# Patient Record
Sex: Male | Born: 1995 | Race: White | Hispanic: No | Marital: Single | Smoking: Current every day smoker
Health system: Southern US, Community
[De-identification: ages and names within clinical notes are randomized; demographics above are authoritative.]

## PROBLEM LIST (undated history)

## (undated) DIAGNOSIS — J45909 Unspecified asthma, uncomplicated: Secondary | ICD-10-CM

## (undated) HISTORY — PX: NO PAST SURGERIES: SHX2092

---
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
## 2017-03-16 ENCOUNTER — Encounter: Payer: Self-pay | Admitting: Emergency Medicine

## 2017-03-16 ENCOUNTER — Emergency Department: Payer: Self-pay

## 2017-03-16 ENCOUNTER — Emergency Department
Admission: EM | Admit: 2017-03-16 | Discharge: 2017-03-16 | Disposition: A | Discharge status: Home / Self Care | Payer: Self-pay | Attending: Emergency Medicine | Admitting: Emergency Medicine

## 2017-03-16 DIAGNOSIS — R109 Unspecified abdominal pain: Secondary | ICD-10-CM

## 2017-03-16 DIAGNOSIS — J45909 Unspecified asthma, uncomplicated: Secondary | ICD-10-CM | POA: Insufficient documentation

## 2017-03-16 DIAGNOSIS — F17210 Nicotine dependence, cigarettes, uncomplicated: Secondary | ICD-10-CM | POA: Insufficient documentation

## 2017-03-16 DIAGNOSIS — R1030 Lower abdominal pain, unspecified: Secondary | ICD-10-CM | POA: Insufficient documentation

## 2017-03-16 HISTORY — DX: Unspecified asthma, uncomplicated: J45.909

## 2017-03-16 LAB — COMPREHENSIVE METABOLIC PANEL
ALBUMIN: 4.9 g/dL (ref 3.5–5.0)
ALT: 14 U/L — ABNORMAL LOW (ref 17–63)
AST: 23 U/L (ref 15–41)
Alkaline Phosphatase: 53 U/L (ref 38–126)
Anion gap: 8 (ref 5–15)
BUN: 13 mg/dL (ref 6–20)
CALCIUM: 10.7 mg/dL — AB (ref 8.9–10.3)
CO2: 25 mmol/L (ref 22–32)
CREATININE: 1.01 mg/dL (ref 0.61–1.24)
Chloride: 104 mmol/L (ref 101–111)
GFR calc Af Amer: >60 mL/min (ref >60)
GFR calc non Af Amer: >60 mL/min (ref >60)
GLUCOSE: 101 mg/dL — AB (ref 65–99)
Potassium: 4 mmol/L (ref 3.5–5.1)
SODIUM: 137 mmol/L (ref 135–145)
Total Bilirubin: 0.6 mg/dL (ref 0.3–1.2)
Total Protein: 8.4 g/dL — ABNORMAL HIGH (ref 6.5–8.1)

## 2017-03-16 LAB — CBC
HCT: 47 % (ref 40.0–52.0)
Hemoglobin: 16.4 g/dL (ref 13.0–18.0)
MCH: 32 pg (ref 26.0–34.0)
MCHC: 34.9 g/dL (ref 32.0–36.0)
MCV: 91.6 fL (ref 80.0–100.0)
PLATELETS: 207 10*3/uL (ref 150–440)
RBC: 5.14 MIL/uL (ref 4.40–5.90)
RDW: 13.6 % (ref 11.5–14.5)
WBC: 8.4 10*3/uL (ref 3.8–10.6)

## 2017-03-16 LAB — LIPASE, BLOOD: Lipase: 36 U/L (ref 11–51)

## 2017-03-16 MED ORDER — IOPAMIDOL (ISOVUE-300) INJECTION 61%
100.0000 mL | Freq: Once | INTRAVENOUS | Status: AC | PRN
  Administered 2017-03-16: 100 mL via INTRAVENOUS
  Filled 2017-03-16: qty 100

## 2017-03-16 MED ORDER — METRONIDAZOLE 500 MG PO TABS: 500.0000 mg | ORAL_TABLET | Freq: Two times a day (BID) | ORAL | 0 refills | Status: AC

## 2017-03-16 MED ORDER — CIPROFLOXACIN HCL 500 MG PO TABS: 500.0000 mg | ORAL_TABLET | Freq: Two times a day (BID) | ORAL | 0 refills | Status: AC

## 2017-03-16 NOTE — ED Provider Notes (Signed)
[facility] Emergency Department Provider Note    First MD Initiated Contact with Patient 03/16/17 1123     (approximate)  I have reviewed the triage vital signs and the nursing notes.   HISTORY  Chief Complaint Abdominal Pain    HPI Blake Irwin is a 21 y.o. male resents with 2 week history of lower abdominal pain blood and mucus noted in stool. Patient states last few bowel movements have been "nothing but mucous with blood". Patient does admit to generalized abdominal pain with a pain score 5 out of 10 described as sharp. Patient denies any vomiting no nausea. Patient denies any fever no urinary symptoms.   Past Medical History:  Diagnosis Date  . Asthma     There are no active problems to display for this patient.   Past surgical history None  Prior to Admission medications   Medication Sig Start Date End Date Taking? Authorizing Provider  ciprofloxacin (CIPRO) 500 MG tablet Take 1 tablet (500 mg total) by mouth 2 (two) times daily. 03/16/17 03/26/17  Darci Current, MD  metroNIDAZOLE (FLAGYL) 500 MG tablet Take 1 tablet (500 mg total) by mouth 2 (two) times daily. 03/16/17 03/26/17  Darci Current, MD    Allergies Patient has no known allergies.  No family history on file.  Social History Social History  Substance Use Topics  . Smoking status: Current Every Day Smoker    Packs/day: 1.00    Types: Cigarettes  . Smokeless tobacco: Not on file  . Alcohol use Not on file    Review of Systems Constitutional: No fever/chills Eyes: No visual changes. ENT: No sore throat. Cardiovascular: Denies chest pain. Respiratory: Denies shortness of breath. Gastrointestinal: Positive for abdominal pain mucus and blood per rectum Genitourinary: Negative for dysuria. Musculoskeletal: Negative for back pain. Integumentary: Negative for rash. Neurological: Negative for headaches, focal weakness or  numbness.   ____________________________________________   PHYSICAL EXAM:  VITAL SIGNS: ED Triage Vitals  Enc Vitals Group     BP 03/16/17 0941 (!) 149/99     Pulse Rate 03/16/17 0941 87     Resp 03/16/17 0941 18     Temp 03/16/17 0941 97.8 F (36.6 C)     Temp Source 03/16/17 0941 Oral     SpO2 03/16/17 0941 99 %     Weight 03/16/17 0943 160 lb (72.6 kg)     Height 03/16/17 0943 5\' 10"  (1.778 m)     Head Circumference --      Peak Flow --      Pain Score 03/16/17 0941 5     Pain Loc --      Pain Edu? --      Excl. in GC? --     Constitutional: Alert and oriented. Well appearing and in no acute distress. Eyes: Conjunctivae are normal. PERRL. EOMI. Head: Atraumatic. Mouth/Throat: Mucous membranes are moist.Oropharynx non-erythematous. Neck: No stridor.   Cardiovascular: Normal rate, regular rhythm. Good peripheral circulation. Grossly normal heart sounds. Respiratory: Normal respiratory effort.  No retractions. Lungs CTAB. Gastrointestinal: Soft and nontender. No distention. Guaiac-positive stools Musculoskeletal: No lower extremity tenderness nor edema. No gross deformities of extremities. Neurologic:  Normal speech and language. No gross focal neurologic deficits are appreciated.  Skin:  Skin is warm, dry and intact. No rash noted. Psychiatric: Mood and affect are normal. Speech and behavior are normal.  ____________________________________________   LABS (all labs ordered are listed, but only abnormal results are displayed)  Labs Reviewed  COMPREHENSIVE METABOLIC PANEL - Abnormal; Notable for the following:       Result Value   Glucose, Bld 101 (*)    Calcium 10.7 (*)    Total Protein 8.4 (*)    ALT 14 (*)    All other components within normal limits  LIPASE, BLOOD  CBC    RADIOLOGY I, Gary N Jahmeir Geisen, personally viewed and evaluated these images (plain radiographs) as part of my medical decision making, as well as reviewing the written report by the  radiologist.  Ct Abdomen Pelvis W Contrast  Result Date: 03/16/2017 CLINICAL DATA:  Lower abdominal pain for 2 weeks. EXAM: CT ABDOMEN AND PELVIS WITH CONTRAST TECHNIQUE: Multidetector CT imaging of the abdomen and pelvis was performed using the standard protocol following bolus administration of intravenous contrast. CONTRAST:  100mL ISOVUE-300 IOPAMIDOL (ISOVUE-300) INJECTION 61% COMPARISON:  None. FINDINGS: Lower chest: No acute abnormality. Hepatobiliary: No focal liver abnormality is seen. No gallstones, gallbladder wall thickening, or biliary dilatation. Pancreas: Unremarkable. No pancreatic ductal dilatation or surrounding inflammatory changes. Spleen: Normal in size without focal abnormality. Adrenals/Urinary Tract: Adrenal glands appear normal. Bilateral renal cysts are noted. No hydronephrosis or renal obstruction is noted. No renal or ureteral calculi are noted. Urinary bladder appears normal. Stomach/Bowel: Stomach is within normal limits. Appendix appears normal. No evidence of bowel wall thickening, distention, or inflammatory changes. Vascular/Lymphatic: No significant vascular findings are present. No enlarged abdominal or pelvic lymph nodes. Reproductive: Prostate is unremarkable. Other: No abdominal wall hernia or abnormality. No abdominopelvic ascites. Musculoskeletal: No acute or significant osseous findings. IMPRESSION: No acute abnormality seen in the abdomen or pelvis. Electronically Signed   By: Lupita RaiderJames  Green Jr, M.D.   On: 03/16/2017 12:16      Procedures   ____________________________________________   INITIAL IMPRESSION / ASSESSMENT AND PLAN / ED COURSE  Pertinent labs & imaging results that were available during my care of the patient were reviewed by me and considered in my medical decision making (see chart for details).  Given history and physical exam concern for inflammatory bowel disease. She CT scan revealed no acute abnormality in the abdomen and pelvis. Patient  will be referred to Dr. Servando SnareWohl gastroenterologist for further outpatient evaluation      ____________________________________________  FINAL CLINICAL IMPRESSION(S) / ED DIAGNOSES  Final diagnoses:  Abdominal pain, unspecified abdominal location     MEDICATIONS GIVEN DURING THIS VISIT:  Medications  iopamidol (ISOVUE-300) 61 % injection 100 mL (100 mLs Intravenous Contrast Given 03/16/17 1156)     NEW OUTPATIENT MEDICATIONS STARTED DURING THIS VISIT:  New Prescriptions   CIPROFLOXACIN (CIPRO) 500 MG TABLET    Take 1 tablet (500 mg total) by mouth 2 (two) times daily.   METRONIDAZOLE (FLAGYL) 500 MG TABLET    Take 1 tablet (500 mg total) by mouth 2 (two) times daily.    Modified Medications   No medications on file    Discontinued Medications   No medications on file     Note:  This document was prepared using Dragon voice recognition software and may include unintentional dictation errors.    Darci CurrentBrown,  N, MD 03/16/17 [phone]

## 2017-03-16 NOTE — ED Notes (Signed)
ED Provider at bedside. 

## 2017-03-16 NOTE — ED Triage Notes (Signed)
Lower abd pain with small amounts of blood and mucous x 2 weeks.

## 2017-04-01 ENCOUNTER — Encounter: Payer: Self-pay | Admitting: Gastroenterology

## 2017-04-01 ENCOUNTER — Ambulatory Visit (INDEPENDENT_AMBULATORY_CARE_PROVIDER_SITE_OTHER): Payer: Self-pay | Admitting: Gastroenterology

## 2017-04-01 ENCOUNTER — Other Ambulatory Visit: Payer: Self-pay

## 2017-04-01 VITALS — BP 137/71 | HR 80 | Temp 98.3°F | Ht 70.5 in | Wt 148.5 lb

## 2017-04-01 DIAGNOSIS — K921 Melena: Secondary | ICD-10-CM

## 2017-04-01 DIAGNOSIS — R1013 Epigastric pain: Secondary | ICD-10-CM

## 2017-04-01 DIAGNOSIS — R634 Abnormal weight loss: Secondary | ICD-10-CM

## 2017-04-01 NOTE — Progress Notes (Signed)
Gastroenterology Consultation  Referring Provider:     Dr. Manson Passey Primary Care Physician:  Patient, No Pcp Per Primary Gastroenterologist:  Dr. Servando Snare     Reason for Consultation:     Abdominal pain with hematochezia        HPI:   Blake Irwin is a 21 y.o. y/o male referred for consultation & management of Abdominal pain with hematochezia by Dr. Patient, No Pcp Per.  This patient comes in today after being seen in emergency room back on the 14th of this month due to a history of abdominal pain in the lower abdomen that started 2 weeks before he was seen in the emergency room. The patient states that his bowel movements at that time were nothing but blood and mucus. He also states that he had abdominal pain at that time that was a 5 out of 10. The patient had a CT scan of the abdomen at that time that did not show any abnormalities in the abdomen or pelvis. His CBC was normal with a normal white cell count and normal red cell count. The patient's liver enzymes were also checked and were not elevated. There is no elevation in the patient's lipase. The ER was concerned that the patient may have inflammatory bowel disease despite no acute abnormality seen on the CT scan. The patient reports that he has had some weight loss. The patient was then sent to me for further evaluation.   Past Medical History:  Diagnosis Date  . Asthma     No past surgical history on file.  Prior to Admission medications   Not on File    No family history on file.   Social History  Substance Use Topics  . Smoking status: Current Every Day Smoker    Packs/day: 1.00    Types: Cigarettes  . Smokeless tobacco: Not on file  . Alcohol use Not on file    Allergies as of 04/01/2017  . (No Known Allergies)    Review of Systems:    All systems reviewed and negative except where noted in HPI.   Physical Exam:  There were no vitals taken for this visit. No LMP for male patient. Psych:  Alert and cooperative.  Normal mood and affect. General:   Alert,  Well-developed, well-nourished, pleasant and cooperative in NAD Head:  Normocephalic and atraumatic. Eyes:  Sclera clear, no icterus.   Conjunctiva pink. Ears:  Normal auditory acuity. Nose:  No deformity, discharge, or lesions. Mouth:  No deformity or lesions,oropharynx pink & moist. Neck:  Supple; no masses or thyromegaly. Lungs:  Respirations even and unlabored.  Clear throughout to auscultation.   No wheezes, crackles, or rhonchi. No acute distress. Heart:  Regular rate and rhythm; no murmurs, clicks, rubs, or gallops. Abdomen:  Normal bowel sounds.  No bruits.  Soft, mildly diffusely tender and non-distended without masses, hepatosplenomegaly or hernias noted.  No guarding or rebound tenderness.  Negative Carnett sign.   Rectal:  Deferred.  Msk:  Symmetrical without gross deformities.  Good, equal movement & strength bilaterally. Pulses:  Normal pulses noted. Extremities:  No clubbing or edema.  No cyanosis. Neurologic:  Alert and oriented x3;  grossly normal neurologically. Skin:  Intact without significant lesions or rashes.  No jaundice. Lymph Nodes:  No significant cervical adenopathy. Psych:  Alert and cooperative. Normal mood and affect.  Imaging Studies: Ct Abdomen Pelvis W Contrast  Result Date: 03/16/2017 CLINICAL DATA:  Lower abdominal pain for 2 weeks. EXAM: CT  ABDOMEN AND PELVIS WITH CONTRAST TECHNIQUE: Multidetector CT imaging of the abdomen and pelvis was performed using the standard protocol following bolus administration of intravenous contrast. CONTRAST:  100mL ISOVUE-300 IOPAMIDOL (ISOVUE-300) INJECTION 61% COMPARISON:  None. FINDINGS: Lower chest: No acute abnormality. Hepatobiliary: No focal liver abnormality is seen. No gallstones, gallbladder wall thickening, or biliary dilatation. Pancreas: Unremarkable. No pancreatic ductal dilatation or surrounding inflammatory changes. Spleen: Normal in size without focal abnormality.  Adrenals/Urinary Tract: Adrenal glands appear normal. Bilateral renal cysts are noted. No hydronephrosis or renal obstruction is noted. No renal or ureteral calculi are noted. Urinary bladder appears normal. Stomach/Bowel: Stomach is within normal limits. Appendix appears normal. No evidence of bowel wall thickening, distention, or inflammatory changes. Vascular/Lymphatic: No significant vascular findings are present. No enlarged abdominal or pelvic lymph nodes. Reproductive: Prostate is unremarkable. Other: No abdominal wall hernia or abnormality. No abdominopelvic ascites. Musculoskeletal: No acute or significant osseous findings. IMPRESSION: No acute abnormality seen in the abdomen or pelvis. Electronically Signed   By: Lupita RaiderJames  Green Jr, M.D.   On: 03/16/2017 12:16    Assessment and Plan:   Blake DaftJacob L Irwin is a 21 y.o. y/o male who has been having weight loss rectal bleeding with mucus per rectum and lower abdominal pain. The patient is concerned that he may have Crohn's disease. He has a great aunts who has ulcerative colitis and another great aunt who has irritable bowel syndrome. The patient has been recommended to have a colonoscopy to rule out inflammatory bowel disease. I have discussed risks & benefits which include, but are not limited to, bleeding, infection, perforation & drug reaction.  The patient agrees with this plan & written consent will be obtained.     Blake Miniumarren Nikash Mortensen, MD. Blake GrahamFACG   Note: This dictation was prepared with Dragon dictation along with smaller phrase technology. Any transcriptional errors that result from this process are unintentional.

## 2017-04-05 ENCOUNTER — Emergency Department: Payer: Self-pay

## 2017-04-05 ENCOUNTER — Encounter: Payer: Self-pay | Admitting: Emergency Medicine

## 2017-04-05 ENCOUNTER — Emergency Department
Admission: EM | Admit: 2017-04-05 | Discharge: 2017-04-05 | Disposition: A | Discharge status: Home / Self Care | Payer: Self-pay | Attending: Emergency Medicine | Admitting: Emergency Medicine

## 2017-04-05 DIAGNOSIS — N23 Unspecified renal colic: Secondary | ICD-10-CM | POA: Insufficient documentation

## 2017-04-05 DIAGNOSIS — J45909 Unspecified asthma, uncomplicated: Secondary | ICD-10-CM | POA: Insufficient documentation

## 2017-04-05 DIAGNOSIS — F17210 Nicotine dependence, cigarettes, uncomplicated: Secondary | ICD-10-CM | POA: Insufficient documentation

## 2017-04-05 DIAGNOSIS — N200 Calculus of kidney: Secondary | ICD-10-CM | POA: Insufficient documentation

## 2017-04-05 LAB — COMPREHENSIVE METABOLIC PANEL
ALBUMIN: 4.9 g/dL (ref 3.5–5.0)
ALT: 15 U/L — ABNORMAL LOW (ref 17–63)
ANION GAP: 9 (ref 5–15)
AST: 18 U/L (ref 15–41)
Alkaline Phosphatase: 53 U/L (ref 38–126)
BUN: 8 mg/dL (ref 6–20)
CO2: 25 mmol/L (ref 22–32)
Calcium: 9.6 mg/dL (ref 8.9–10.3)
Chloride: 104 mmol/L (ref 101–111)
Creatinine, Ser: 1.11 mg/dL (ref 0.61–1.24)
GFR calc non Af Amer: >60 mL/min (ref >60)
GLUCOSE: 108 mg/dL — AB (ref 65–99)
Potassium: 4.1 mmol/L (ref 3.5–5.1)
SODIUM: 138 mmol/L (ref 135–145)
Total Bilirubin: 0.9 mg/dL (ref 0.3–1.2)
Total Protein: 7.8 g/dL (ref 6.5–8.1)

## 2017-04-05 LAB — URINALYSIS, COMPLETE (UACMP) WITH MICROSCOPIC
BACTERIA UA: NONE SEEN
Bilirubin Urine: NEGATIVE
Glucose, UA: NEGATIVE mg/dL
Ketones, ur: NEGATIVE mg/dL
Leukocytes, UA: NEGATIVE
Nitrite: NEGATIVE
PROTEIN: 30 mg/dL — AB
Specific Gravity, Urine: 1.024 (ref 1.005–1.030)
pH: 6 (ref 5.0–8.0)

## 2017-04-05 LAB — CBC
HCT: 46.1 % (ref 40.0–52.0)
Hemoglobin: 16.2 g/dL (ref 13.0–18.0)
MCH: 32.1 pg (ref 26.0–34.0)
MCHC: 35 g/dL (ref 32.0–36.0)
MCV: 91.8 fL (ref 80.0–100.0)
Platelets: 188 10*3/uL (ref 150–440)
RBC: 5.03 MIL/uL (ref 4.40–5.90)
RDW: 13.8 % (ref 11.5–14.5)
WBC: 10.3 10*3/uL (ref 3.8–10.6)

## 2017-04-05 LAB — LIPASE, BLOOD: LIPASE: 43 U/L (ref 11–51)

## 2017-04-05 MED ORDER — OXYCODONE-ACETAMINOPHEN 5-325 MG PO TABS
1.0000 | ORAL_TABLET | ORAL | Status: DC | PRN
  Administered 2017-04-05: 1 via ORAL
  Filled 2017-04-05: qty 1

## 2017-04-05 MED ORDER — DOCUSATE SODIUM 100 MG PO CAPS: ORAL_CAPSULE | ORAL | 0 refills | Status: AC

## 2017-04-05 MED ORDER — ONDANSETRON HCL 4 MG PO TABS: ORAL_TABLET | ORAL | 0 refills | Status: AC

## 2017-04-05 MED ORDER — HYDROCODONE-ACETAMINOPHEN 5-325 MG PO TABS: 1.0000 | ORAL_TABLET | ORAL | 0 refills | Status: AC | PRN

## 2017-04-05 MED ORDER — TAMSULOSIN HCL 0.4 MG PO CAPS: ORAL_CAPSULE | ORAL | 0 refills | Status: AC

## 2017-04-05 MED ORDER — ONDANSETRON 4 MG PO TBDP
4.0000 mg | ORAL_TABLET | Freq: Once | ORAL | Status: AC | PRN
  Administered 2017-04-05: 4 mg via ORAL
  Filled 2017-04-05: qty 1

## 2017-04-05 NOTE — ED Triage Notes (Signed)
Pt presents to ED c/o RLQ pain wrapping around to R lower back and radiating into R groin. Pt states difficulty urinating, only dribbles at a time.

## 2017-04-05 NOTE — Discharge Instructions (Signed)
You have been seen in the Emergency Department (ED) today for pain that we believe based on your workup, is caused by kidney stones.  As we have discussed, please drink plenty of fluids.  Please make a follow up appointment with the physician(s) listed elsewhere in this documentation. ° °You may take pain medication as needed but ONLY as prescribed.  Please also take your prescribed Flomax daily.  We also recommend that you take over-the-counter ibuprofen regularly according to label instructions over the next 5 days.  Take it with meals to minimize stomach discomfort. ° °Please see your doctor as soon as possible as stones may take 1-3 weeks to pass and you may require additional care or medications. ° °Do not drink alcohol, drive or participate in any other potentially dangerous activities while taking opiate pain medication as it may make you sleepy. Do not take this medication with any other sedating medications, either prescription or over-the-counter. If you were prescribed Percocet or Vicodin, do not take these with acetaminophen (Tylenol) as it is already contained within these medications. °  °Take Norco as needed for severe pain.  This medication is an opiate (or narcotic) pain medication and can be habit forming.  Use it as little as possible to achieve adequate pain control.  Do not use or use it with extreme caution if you have a history of opiate abuse or dependence.  If you are on a pain contract with your primary care doctor or a pain specialist, be sure to let them know you were prescribed this medication today from the [facility].  This medication is intended for your use only - do not give any to anyone else and keep it in a secure place where nobody else, especially children, have access to it.  It will also cause or worsen constipation, so you may want to consider taking an over-the-counter stool softener while you are taking this medication. ° °Return to the  Emergency Department (ED) or call your doctor if you have any worsening pain, fever, painful urination, are unable to urinate, or develop other symptoms that concern you. ° °

## 2017-04-05 NOTE — ED Provider Notes (Addendum)
[facility] Emergency Department Provider Note  ____________________________________________   First MD Initiated Contact with Patient 04/05/17 1447     (approximate)  I have reviewed the triage vital signs and the nursing notes.   HISTORY  Chief Complaint Flank Pain    HPI Blake Irwin is a 21 y.o. male with no specific history but who is being worked up for possible inflammatory bowel disease by gastroenterology.  He presents for evaluation of about 2 days of persistent and waxing and waning but essentially constant pain in his right flank that is radiated to the right lower part of his abdomen.  He has no history of kidney stones.  He describes the pain as sharp and stabbing acutely and followed by a dull ache that radiates from his flank to his right lower abdomen.  It is accompanied with nausea and he feels like he is having difficulty urinating.  Nothing particular makes it better nor worse.  He denies fever/chills, chest pain, shortness of breath, diarrhea, and GI bleeding.  He has not specifically seen any blood in his urine.   Past Medical History:  Diagnosis Date  . Asthma     There are no active problems to display for this patient.   Past Surgical History:  Procedure Laterality Date  . NO PAST SURGERIES      Prior to Admission medications   Medication Sig Start Date End Date Taking? Authorizing Provider  docusate sodium (COLACE) 100 MG capsule Take 1 tablet once or twice daily as needed for constipation while taking narcotic pain medicine 04/05/17   Loleta Rose, MD  HYDROcodone-acetaminophen (NORCO/VICODIN) 5-325 MG tablet Take 1-2 tablets by mouth every 4 (four) hours as needed for moderate pain. 04/05/17   Loleta Rose, MD  ondansetron [facility]) 4 MG tablet Take 1-2 tabs by mouth every 8 hours as needed for nausea/vomiting 04/05/17   Loleta Rose, MD  tamsulosin [facility]) 0.4 MG CAPS capsule Take 1 tablet by mouth daily until you pass the  kidney stone or no longer have symptoms 04/05/17   Loleta Rose, MD    Allergies Patient has no known allergies.  Family History  Problem Relation Age of Onset  . Hypertension Mother   . Drug abuse Father   . Ulcerative colitis Maternal Aunt     Social History Social History  Substance Use Topics  . Smoking status: Current Every Day Smoker    Packs/day: 1.00    Types: Cigarettes  . Smokeless tobacco: Former Neurosurgeon  . Alcohol use No    Review of Systems Constitutional: No fever/chills Eyes: No visual changes. ENT: No sore throat. Cardiovascular: Denies chest pain. Respiratory: Denies shortness of breath. Gastrointestinal: Right-sided flank pain radiating to the right lower quadrant of his abdomen over the last couple of days with nausea. Genitourinary: Negative for dysuria.  Difficulty urinating. Musculoskeletal: Negative for neck pain.  Right Flank pain. Integumentary: Negative for rash. Neurological: Negative for headaches, focal weakness or numbness.   ____________________________________________   PHYSICAL EXAM:  VITAL SIGNS: ED Triage Vitals  Enc Vitals Group     BP 04/05/17 1259 (!) 141/91     Pulse Rate 04/05/17 1259 (!) 102     Resp 04/05/17 1259 16     Temp 04/05/17 1259 98.4 F (36.9 C)     Temp Source 04/05/17 1259 Oral     SpO2 04/05/17 1259 99 %     Weight 04/05/17 1259 67.1 kg (148 lb)     Height 04/05/17  1259 1.778 m (5\' 10" )     Head Circumference --      Peak Flow --      Pain Score 04/05/17 1303 10     Pain Loc --      Pain Edu? --      Excl. in GC? --     Constitutional: Alert and oriented. Well appearing and in no acute distress. Eyes: Conjunctivae are normal.  Head: Atraumatic. Nose: No congestion/rhinnorhea. Mouth/Throat: Mucous membranes are moist. Neck: No stridor.  No meningeal signs.   Cardiovascular: Normal rate, regular rhythm. Good peripheral circulation. Grossly normal heart sounds. Respiratory: Normal respiratory effort.   No retractions. Lungs CTAB. Gastrointestinal: Soft and nontender. No distention.  Musculoskeletal: Mild right CVA tenderness.  No lower extremity tenderness nor edema. No gross deformities of extremities. Neurologic:  Normal speech and language. No gross focal neurologic deficits are appreciated.  Skin:  Skin is warm, dry and intact. No rash noted. Psychiatric: Mood and affect are normal. Speech and behavior are normal.  ____________________________________________   LABS (all labs ordered are listed, but only abnormal results are displayed)  Labs Reviewed  COMPREHENSIVE METABOLIC PANEL - Abnormal; Notable for the following:       Result Value   Glucose, Bld 108 (*)    ALT 15 (*)    All other components within normal limits  URINALYSIS, COMPLETE (UACMP) WITH MICROSCOPIC - Abnormal; Notable for the following:    Color, Urine YELLOW (*)    APPearance HAZY (*)    Hgb urine dipstick LARGE (*)    Protein, ur 30 (*)    Squamous Epithelial / LPF 0-5 (*)    All other components within normal limits  LIPASE, BLOOD  CBC   ____________________________________________  EKG  None - EKG not ordered by ED physician ____________________________________________  RADIOLOGY   Ct Renal Stone Study  Result Date: 04/05/2017 CLINICAL DATA:  Right lower quadrant pain extending to the right groin. Difficulty urinating. EXAM: CT ABDOMEN AND PELVIS WITHOUT CONTRAST TECHNIQUE: Multidetector CT imaging of the abdomen and pelvis was performed following the standard protocol without IV contrast. COMPARISON:  03/16/2017 FINDINGS: Lower chest: Normal Hepatobiliary: Normal Pancreas: Normal Spleen: Normal Adrenals/Urinary Tract: Adrenal glands are normal. Numerous small renal calculi bilaterally, ranging in size from 1-4 mm in size. No sign of passing stone on the left. On the right, there is hydroureteronephrosis with the ureter dilated to the UVJ where there is a 3 mm stone. No stone in the bladder. Two small  simple cysts of the right kidney. Stomach/Bowel: Normal Vascular/Lymphatic: Normal Reproductive: Normal Other: No free fluid or air. Musculoskeletal: Normal IMPRESSION: Numerous small calculi within the kidneys, ranging between 1 and 4 mm in size. Hydroureteronephrosis on the right with a 3 mm stone at the right UVJ, ready to pass into the bladder. Electronically Signed   By: Paulina Fusi M.D.   On: 04/05/2017 13:49    ____________________________________________   PROCEDURES  Critical Care performed: No   Procedure(s) performed:   Procedures   ____________________________________________   INITIAL IMPRESSION / ASSESSMENT AND PLAN / ED COURSE  Pertinent labs & imaging results that were available during my care of the patient were reviewed by me and considered in my medical decision making (see chart for details).  The patient is well-appearing and in no acute distress at this time.  He states that he feels much better than he did upon arrival before the Percocet.  The patient has a number of intrarenal kidney stones  and 1 right UVJ stone that should pass on its own based on its size and it does appear ready to fall into the bladder according to the radiologist.  I had my usual and customary kidney stone/renal colic discussion with the patient and his mother.  He has no evidence of infection on the urinalysis.  He should be appropriate for outpatient follow-up.  I gave my usual and customary return precautions.   He understands and agrees with the plan   Clinical Course as of Apr 05 1544  Sun Apr 05, 2017  1545 I reviewed the patient's prescription history over the last 12 months in the multi-state controlled substances database(s) that includes [place], [place], [place], [place], [place], [place], [place], [place], [place], [place], [place], [place], [place], and [place].  The patient has filled no controlled substances during that time.   [CF]      Clinical Course User Index [CF] Loleta RoseForbach, Vrishank Moster, MD    ____________________________________________  FINAL CLINICAL IMPRESSION(S) / ED DIAGNOSES  Final diagnoses:  Ureteral colic  Kidney stone     MEDICATIONS GIVEN DURING THIS VISIT:  Medications  oxyCODONE-acetaminophen (PERCOCET/ROXICET) 5-325 MG per tablet 1 tablet (1 tablet Oral Given 04/05/17 1315)  ondansetron (ZOFRAN-ODT) disintegrating tablet 4 mg (4 mg Oral Given 04/05/17 1315)     NEW OUTPATIENT MEDICATIONS STARTED DURING THIS VISIT:  New Prescriptions   DOCUSATE SODIUM (COLACE) 100 MG CAPSULE    Take 1 tablet once or twice daily as needed for constipation while taking narcotic pain medicine   HYDROCODONE-ACETAMINOPHEN (NORCO/VICODIN) 5-325 MG TABLET    Take 1-2 tablets by mouth every 4 (four) hours as needed for moderate pain.   ONDANSETRON (ZOFRAN) 4 MG TABLET    Take 1-2 tabs by mouth every 8 hours as needed for nausea/vomiting   TAMSULOSIN (FLOMAX) 0.4 MG CAPS CAPSULE    Take 1 tablet by mouth daily until you pass the kidney stone or no longer have symptoms    Modified Medications   No medications on file    Discontinued Medications   No medications on file     Note:  This document was prepared using Dragon voice recognition software and may include unintentional dictation errors.    Loleta RoseForbach, Latrecia Capito, MD 04/05/17 1544    Loleta RoseForbach, [place]endale Wherry, MD 04/05/17 [phone]

## 2017-04-21 ENCOUNTER — Encounter
Admission: RE | Disposition: A | Discharge status: Home / Self Care | Payer: Self-pay | Source: Ambulatory Visit | Attending: Gastroenterology

## 2017-04-21 ENCOUNTER — Ambulatory Visit: Payer: Self-pay | Admitting: Registered Nurse

## 2017-04-21 ENCOUNTER — Ambulatory Visit
Admission: RE | Admit: 2017-04-21 | Discharge: 2017-04-21 | Disposition: A | Discharge status: Home / Self Care | Payer: Self-pay | Source: Ambulatory Visit | Attending: Gastroenterology | Admitting: Gastroenterology

## 2017-04-21 DIAGNOSIS — Z79899 Other long term (current) drug therapy: Secondary | ICD-10-CM | POA: Insufficient documentation

## 2017-04-21 DIAGNOSIS — F17210 Nicotine dependence, cigarettes, uncomplicated: Secondary | ICD-10-CM | POA: Insufficient documentation

## 2017-04-21 DIAGNOSIS — J45909 Unspecified asthma, uncomplicated: Secondary | ICD-10-CM | POA: Insufficient documentation

## 2017-04-21 DIAGNOSIS — K921 Melena: Secondary | ICD-10-CM | POA: Insufficient documentation

## 2017-04-21 DIAGNOSIS — R1084 Generalized abdominal pain: Secondary | ICD-10-CM

## 2017-04-21 DIAGNOSIS — K219 Gastro-esophageal reflux disease without esophagitis: Secondary | ICD-10-CM | POA: Insufficient documentation

## 2017-04-21 HISTORY — PX: COLONOSCOPY WITH PROPOFOL: SHX5780

## 2017-04-21 SURGERY — COLONOSCOPY WITH PROPOFOL
Anesthesia: General

## 2017-04-21 MED ORDER — PROPOFOL 500 MG/50ML IV EMUL
INTRAVENOUS | Status: AC
  Filled 2017-04-21: qty 50

## 2017-04-21 MED ORDER — PROPOFOL 10 MG/ML IV BOLUS
INTRAVENOUS | Status: DC | PRN
  Administered 2017-04-21: 40 mg via INTRAVENOUS
  Administered 2017-04-21: 70 mg via INTRAVENOUS
  Administered 2017-04-21: 30 mg via INTRAVENOUS
  Administered 2017-04-21: 20 mg via INTRAVENOUS
  Administered 2017-04-21: 40 mg via INTRAVENOUS

## 2017-04-21 MED ORDER — SODIUM CHLORIDE 0.9 % IV SOLN
INTRAVENOUS | Status: DC
  Administered 2017-04-21: 10:00:00 via INTRAVENOUS

## 2017-04-21 MED ORDER — MIDAZOLAM HCL 2 MG/2ML IJ SOLN
INTRAMUSCULAR | Status: AC
  Filled 2017-04-21: qty 2

## 2017-04-21 MED ORDER — PROPOFOL 500 MG/50ML IV EMUL
INTRAVENOUS | Status: DC | PRN
  Administered 2017-04-21: 160 ug/kg/min via INTRAVENOUS

## 2017-04-21 MED ORDER — MIDAZOLAM HCL 2 MG/2ML IJ SOLN
INTRAMUSCULAR | Status: DC | PRN
  Administered 2017-04-21 (×2): 1 mg via INTRAVENOUS

## 2017-04-21 NOTE — Transfer of Care (Signed)
Immediate Anesthesia Transfer of Care Note  Patient: Blake Irwin  Procedure(s) Performed: Procedure(s): COLONOSCOPY WITH PROPOFOL (N/A)  Patient Location: PACU  Anesthesia Type:General  Level of Consciousness: awake, alert  and oriented  Airway & Oxygen Therapy: Patient Spontanous Breathing and Patient connected to nasal cannula oxygen  Post-op Assessment: Report given to RN and Post -op Vital signs reviewed and stable  Post vital signs: Reviewed and stable  Last Vitals:  Vitals:   04/21/17 1043 04/21/17 1044  BP: 120/67   Pulse: 82   Resp: 16   Temp:  36.1 C    Last Pain:  Vitals:   04/21/17 1043  TempSrc: Tympanic  PainSc:          Complications: No apparent anesthesia complications

## 2017-04-21 NOTE — Anesthesia Post-op Follow-up Note (Cosign Needed)
Anesthesia QCDR form completed.        

## 2017-04-21 NOTE — Anesthesia Preprocedure Evaluation (Signed)
Anesthesia Evaluation  Patient identified by MRN, date of birth, ID band Patient awake    Reviewed: Allergy & Precautions, H&P , NPO status , Patient's Chart, lab work & pertinent test results, reviewed documented beta blocker date and time   History of Anesthesia Complications Negative for: history of anesthetic complications  Airway Mallampati: I  TM Distance: >3 FB Neck ROM: full    Dental no notable dental hx. (+) Dental Advidsory Given, Teeth Intact   Pulmonary neg shortness of breath, asthma , neg sleep apnea, neg COPD, neg recent URI, Current Smoker,           Cardiovascular Exercise Tolerance: Good negative cardio ROS       Neuro/Psych negative neurological ROS  negative psych ROS   GI/Hepatic Neg liver ROS, GERD  ,  Endo/Other  negative endocrine ROS  Renal/GU Renal disease (kidney stones)  negative genitourinary   Musculoskeletal   Abdominal   Peds  Hematology negative hematology ROS (+)   Anesthesia Other Findings Past Medical History: No date: Asthma   Reproductive/Obstetrics negative OB ROS                             Anesthesia Physical Anesthesia Plan  ASA: II  Anesthesia Plan: General   Post-op Pain Management:    Induction: Intravenous  PONV Risk Score and Plan: 1 and Propofol  Airway Management Planned: Natural Airway  Additional Equipment:   Intra-op Plan:   Post-operative Plan:   Informed Consent: I have reviewed the patients History and Physical, chart, labs and discussed the procedure including the risks, benefits and alternatives for the proposed anesthesia with the patient or authorized representative who has indicated his/her understanding and acceptance.   Dental Advisory Given  Plan Discussed with: Anesthesiologist, CRNA and Surgeon  Anesthesia Plan Comments:         Anesthesia Quick Evaluation

## 2017-04-21 NOTE — Op Note (Signed)
[facility] Gastroenterology Patient Name: Blake Irwin Procedure Date: 04/21/2017 10:15 AM MRN: [id number] Account #: [id number] Date of Birth: November 18, 1995 Admit Type: Outpatient Age: 21 Room: [facility] ENDO ROOM 4 Gender: Male Note Status: Finalized Procedure:            Colonoscopy Indications:          Generalized abdominal pain, Hematochezia Providers:            Midge Minium MD, MD Medicines:            Propofol per Anesthesia Complications:        No immediate complications. Procedure:            Pre-Anesthesia Assessment:                       - Prior to the procedure, a History and Physical was                        performed, and patient medications and allergies were                        reviewed. The patient's tolerance of previous                        anesthesia was also reviewed. The risks and benefits of                        the procedure and the sedation options and risks were                        discussed with the patient. All questions were                        answered, and informed consent was obtained. Prior                        Anticoagulants: The patient has taken no previous                        anticoagulant or antiplatelet agents. ASA Grade                        Assessment: II - A patient with mild systemic disease.                        After reviewing the risks and benefits, the patient was                        deemed in satisfactory condition to undergo the                        procedure.                       After obtaining informed consent, the colonoscope was                        passed under direct vision. Throughout the procedure,                        the patient's blood pressure, pulse,  and oxygen                        saturations were monitored continuously. The                        Colonoscope was introduced through the anus and                        advanced to the the terminal ileum. The colonoscopy was                         performed without difficulty. The patient tolerated the                        procedure well. The quality of the bowel preparation                        was excellent. Findings:      The perianal and digital rectal examinations were normal.      The terminal ileum appeared normal. Biopsies were taken with a cold       forceps for histology.      The colon (entire examined portion) appeared normal. Biopsies were taken       with a cold forceps for histology. Impression:           - The examined portion of the ileum was normal.                        Biopsied.                       - The entire examined colon is normal. Biopsied. Recommendation:       - Discharge patient to home.                       - Resume previous diet.                       - Continue present medications.                       - Await pathology results. Procedure Code(s):    --- Professional ---                       [phone], Colonoscopy, flexible; with biopsy, single or                        multiple Diagnosis Code(s):    --- Professional ---                       K92.1, Melena (includes Hematochezia)                       R10.84, Generalized abdominal pain CPT copyright 2016 American Medical Association. All rights reserved. The codes documented in this report are preliminary and upon coder review may  be revised to meet current compliance requirements. Midge Minium MD, MD 04/21/2017 10:35:29 AM This report has been signed electronically. Number of Addenda: 0 Note Initiated On: 04/21/2017 10:15 AM Scope Withdrawal Time: 0 hours 6 minutes 1 second  Total Procedure Duration: 0 hours 8 minutes  56 seconds       [facility]

## 2017-04-21 NOTE — Anesthesia Postprocedure Evaluation (Signed)
Anesthesia Post Note  Patient: Blake Irwin  Procedure(s) Performed: Procedure(s) (LRB): COLONOSCOPY WITH PROPOFOL (N/A)  Patient location during evaluation: Endoscopy Anesthesia Type: General Level of consciousness: awake and alert Pain management: pain level controlled Vital Signs Assessment: post-procedure vital signs reviewed and stable Respiratory status: spontaneous breathing, nonlabored ventilation, respiratory function stable and patient connected to nasal cannula oxygen Cardiovascular status: blood pressure returned to baseline and stable Postop Assessment: no signs of nausea or vomiting Anesthetic complications: no     Last Vitals:  Vitals:   04/21/17 1103 04/21/17 1113  BP: 125/86 124/74  Pulse: 75   Resp: 14 16  Temp:      Last Pain:  Vitals:   04/21/17 1043  TempSrc: Tympanic  PainSc:                  Lenard SimmerAndrew Aniello Christopoulos

## 2017-04-21 NOTE — H&P (Signed)
   Blake Miniumarren Hadar Elgersma, [facility]win [facility] [address] [place], [place] [address] [phone] Fax : [phone]  Primary Care Physician:  Patient, No Pcp Per Primary Gastroenterologist:  Dr. Servando SnareWohl  Pre-Procedure History & Physical: HPI:  Blake Irwin is a 21 y.o. male is here for an colonoscopy.   Past Medical History:  Diagnosis Date  . Asthma     Past Surgical History:  Procedure Laterality Date  . NO PAST SURGERIES      Prior to Admission medications   Medication Sig Start Date End Date Taking? Authorizing Provider  docusate sodium (COLACE) 100 MG capsule Take 1 tablet once or twice daily as needed for constipation while taking narcotic pain medicine Patient not taking: Reported on 04/21/2017 04/05/17   Loleta RoseForbach, Cory, Blake Irwin  HYDROcodone-acetaminophen (NORCO/VICODIN) 5-325 MG tablet Take 1-2 tablets by mouth every 4 (four) hours as needed for moderate pain. Patient not taking: Reported on 04/21/2017 04/05/17   Loleta RoseForbach, Cory, Blake Irwin  ondansetron [facility](ZOFRAN) 4 MG tablet Take 1-2 tabs by mouth every 8 hours as needed for nausea/vomiting Patient not taking: Reported on 04/21/2017 04/05/17   Loleta RoseForbach, Cory, Blake Irwin  tamsulosin [facility](FLOMAX) 0.4 MG CAPS capsule Take 1 tablet by mouth daily until you pass the kidney stone or no longer have symptoms Patient not taking: Reported on 04/21/2017 04/05/17   Loleta RoseForbach, Cory, Blake Irwin    Allergies as of 04/01/2017  . (No Known Allergies)    Family History  Problem Relation Age of Onset  . Hypertension Mother   . Drug abuse Father   . Ulcerative colitis Maternal Aunt     Social History   Social History  . Marital status: Single    Spouse name: N/A  . Number of children: N/A  . Years of education: N/A   Occupational History  . Not on file.   Social History Main Topics  . Smoking status: Current Every Day Smoker    Packs/day: 1.00    Types: Cigarettes  . Smokeless tobacco: Former NeurosurgeonUser  . Alcohol use No  . Drug use: Yes    Types: Marijuana  . Sexual activity:  Not on file   Other Topics Concern  . Not on file   Social History Narrative  . No narrative on file    Review of Systems: See HPI, otherwise negative ROS  Physical Exam: BP 121/76   Pulse (!) 103   Temp 98.3 F (36.8 C) (Tympanic)   Resp 16   Ht 5\' 10"  (1.778 m)   Wt 146 lb (66.2 kg)   SpO2 98%   BMI 20.95 kg/m  General:   Alert,  pleasant and cooperative in NAD Head:  Normocephalic and atraumatic. Neck:  Supple; no masses or thyromegaly. Lungs:  Clear throughout to auscultation.    Heart:  Regular rate and rhythm. Abdomen:  Soft, nontender and nondistended. Normal bowel sounds, without guarding, and without rebound.   Neurologic:  Alert and  oriented x4;  grossly normal neurologically.  Impression/Plan: Blake Irwin is here for an colonoscopy to be performed for hematochezia and abd pain  Risks, benefits, limitations, and alternatives regarding  colonoscopy have been reviewed with the patient.  Questions have been answered.  All parties agreeable.   Blake Miniumarren Hermila Millis, Blake Irwin  04/21/2017, 10:15 AM

## 2017-04-22 ENCOUNTER — Encounter: Payer: Self-pay | Admitting: Gastroenterology

## 2017-04-22 LAB — SURGICAL PATHOLOGY

## 2017-04-24 ENCOUNTER — Telehealth: Payer: Self-pay

## 2017-04-24 NOTE — Telephone Encounter (Signed)
-----   Message from Midge Miniumarren Wohl, MD sent at 04/23/2017  1:31 PM EDT ----- The patient know that all the biopsies of his small bowel and his colon were all normal and did not show any Crohn's disease.

## 2017-04-24 NOTE — Telephone Encounter (Signed)
LVM for pt to return my call.

## 2017-04-27 NOTE — Telephone Encounter (Signed)
Pt notified of colonoscopy results. Pt stated he was still experiencing symptoms that he described to you at his last ov. Still seeing mucus and blood. Please advise.

## 2017-05-05 NOTE — Telephone Encounter (Signed)
Start the patient on a BRAT diet and Anusol suppositories for his hemorrhoids.

## 2017-06-30 IMAGING — CT CT RENAL STONE PROTOCOL
2 of 4 series · 17 of 46 positions shown, 19 images · non-contrast
Comparison: 03/16/2017

CLINICAL DATA: Right lower quadrant pain extending to the right
groin. Difficulty urinating.

EXAM:
CT ABDOMEN AND PELVIS WITHOUT CONTRAST
TECHNIQUE: Multidetector CT imaging of the abdomen and pelvis was performed
following the standard protocol without IV contrast.

[Series 2: stone full standard · axial · 0.72mm/px · z∈[-514,-114]mm · 14 of 88 slices shown, 16 images]
[im 4/88  soft-tissue]
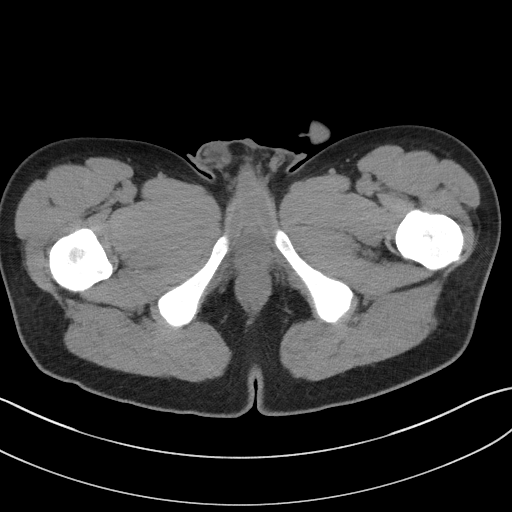
[im 4/88  bone]
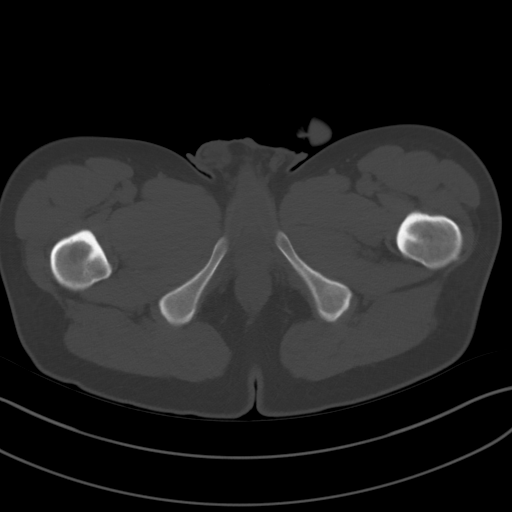
[im 11/88  soft-tissue]
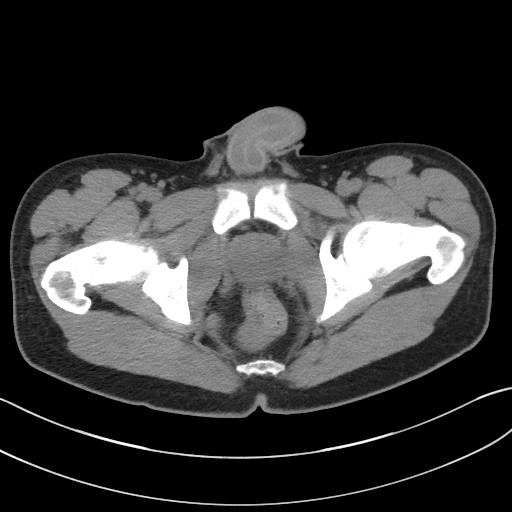
[im 18/88  soft-tissue]
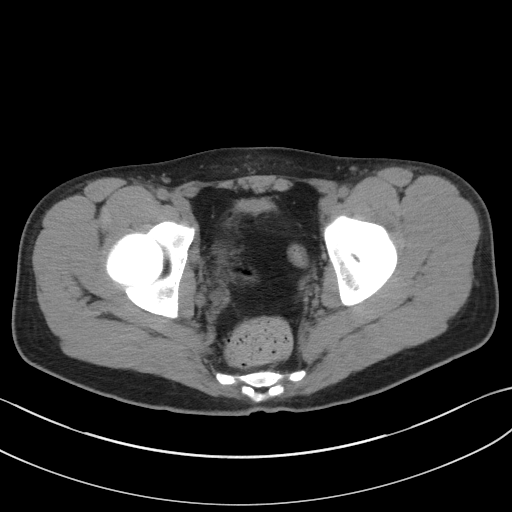
[im 25/88  soft-tissue]
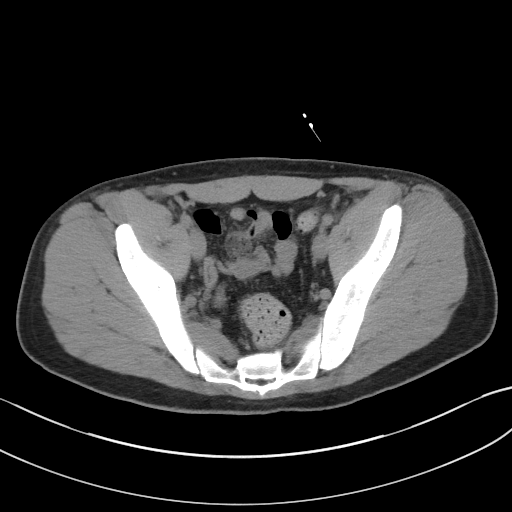
[im 28/88  soft-tissue]
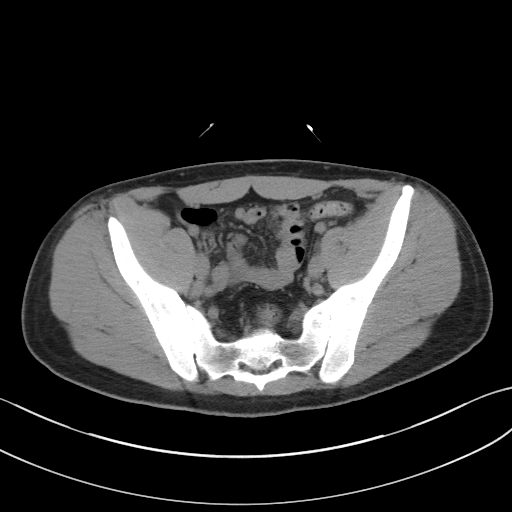
[im 35/88  soft-tissue]
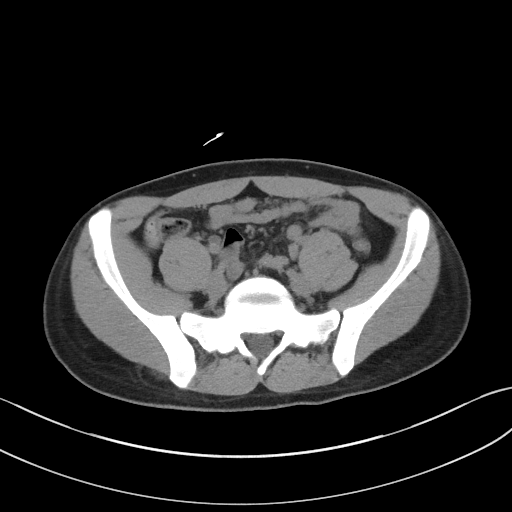
[im 42/88  soft-tissue]
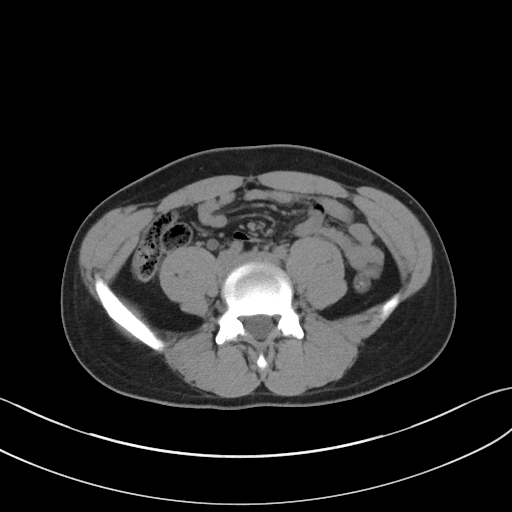
[im 46/88  soft-tissue]
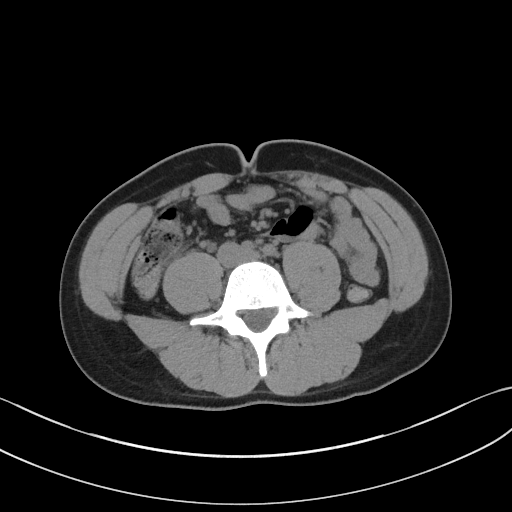
[im 53/88  soft-tissue]
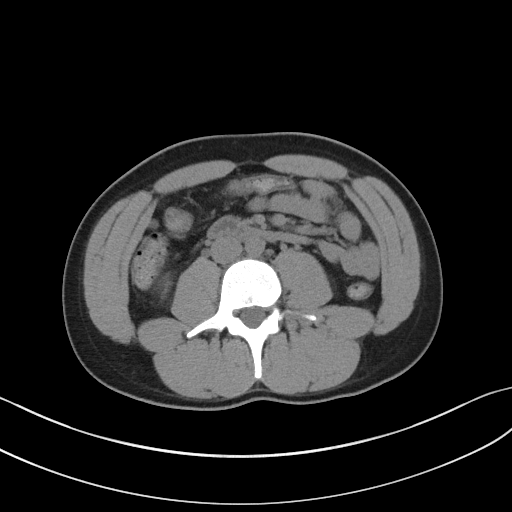
[im 53/88  bone]
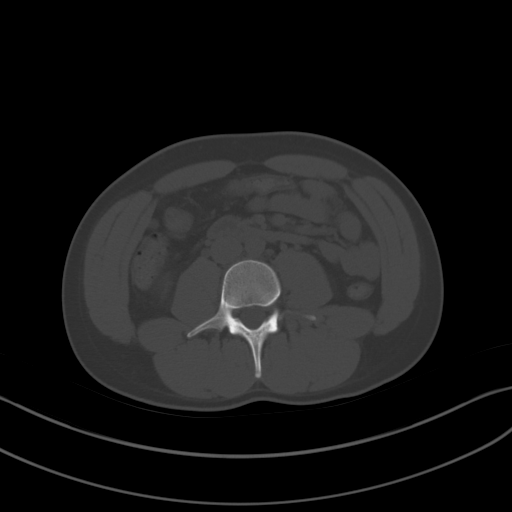
[im 60/88  soft-tissue]
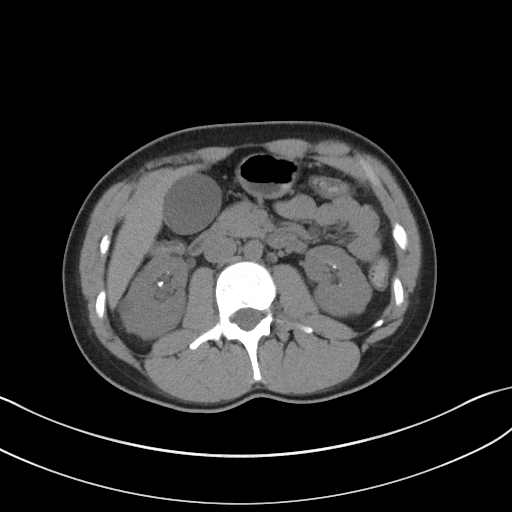
[im 67/88  soft-tissue]
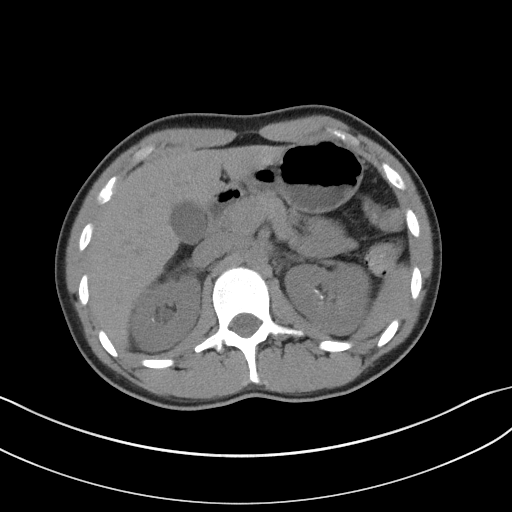
[im 70/88  soft-tissue]
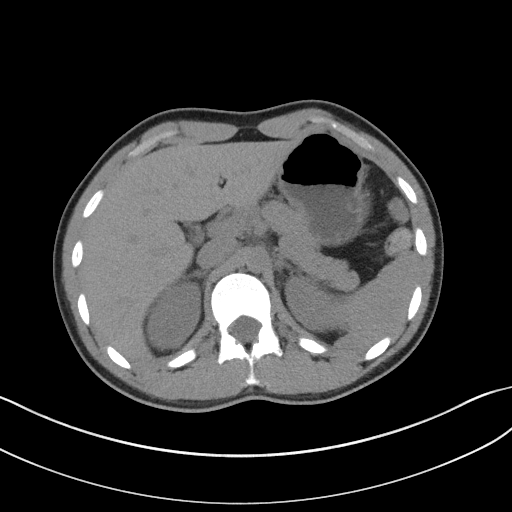
[im 77/88  soft-tissue]
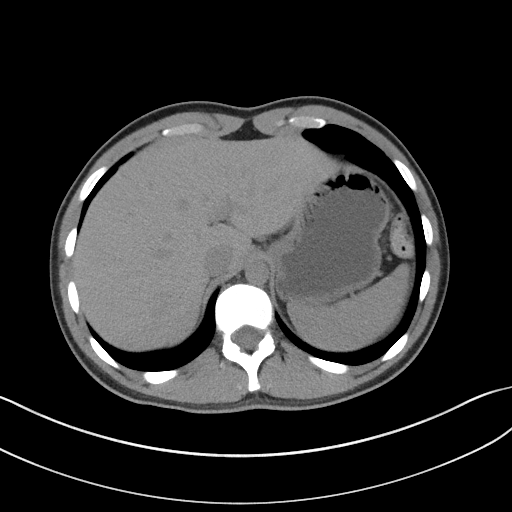
[im 84/88  soft-tissue]
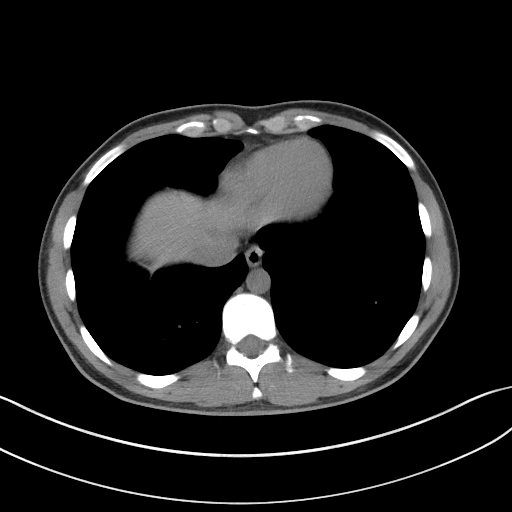

[Series 5: coronal · coronal · 0.74mm/px · 3 of 111 slices shown]
[im 37/111  soft-tissue]
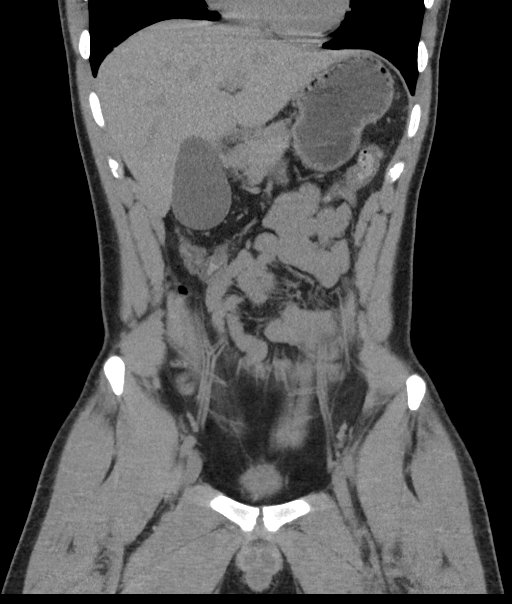
[im 49/111  soft-tissue]
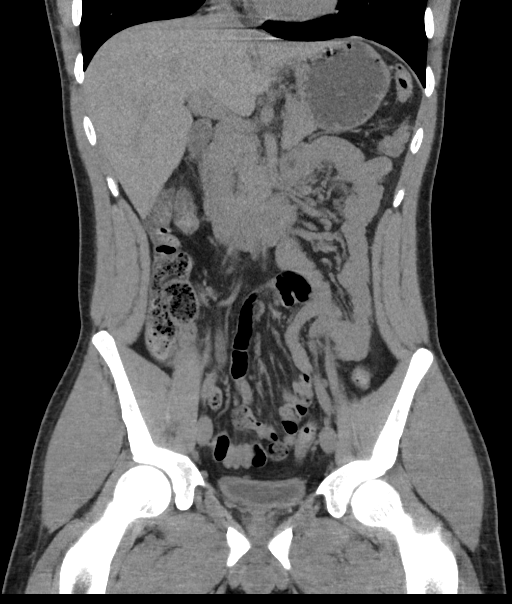
[im 62/111  soft-tissue]
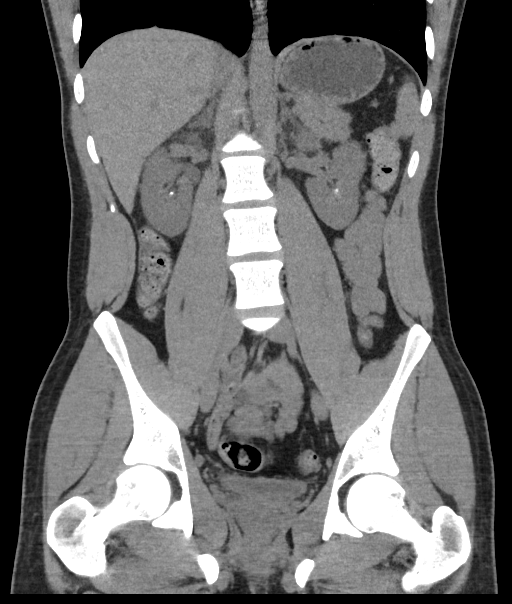

[17 of 46 positions shown; findings below may reference images not displayed]

FINDINGS: Lower chest: Normal

Hepatobiliary: Normal

Pancreas: Normal

Spleen: Normal

Adrenals/Urinary Tract: Adrenal glands are normal. Numerous small
renal calculi bilaterally, ranging in size from 1-4 mm in size. No
sign of passing stone on the left. On the right, there is
hydroureteronephrosis with the ureter dilated to the UVJ where there
is a 3 mm stone. No stone in the bladder. Two small simple cysts of
the right kidney.

Stomach/Bowel: Normal

Vascular/Lymphatic: Normal

Reproductive: Normal

Other: No free fluid or air.

Musculoskeletal: Normal
IMPRESSION: Numerous small calculi within the kidneys, ranging between 1 and 4
mm in size. Hydroureteronephrosis on the right with a 3 mm stone at
the right UVJ, ready to pass into the bladder.
